# Patient Record
Sex: Female | Born: 1955 | Race: White | Hispanic: No | Smoking: Former smoker
Health system: Southern US, Community
[De-identification: ages and names within clinical notes are randomized; demographics above are authoritative.]

## PROBLEM LIST (undated history)

## (undated) DIAGNOSIS — M8580 Other specified disorders of bone density and structure, unspecified site: Secondary | ICD-10-CM

## (undated) DIAGNOSIS — I10 Essential (primary) hypertension: Secondary | ICD-10-CM

## (undated) DIAGNOSIS — T7840XA Allergy, unspecified, initial encounter: Secondary | ICD-10-CM

## (undated) DIAGNOSIS — E785 Hyperlipidemia, unspecified: Secondary | ICD-10-CM

## (undated) DIAGNOSIS — N6009 Solitary cyst of unspecified breast: Secondary | ICD-10-CM

## (undated) DIAGNOSIS — M1990 Unspecified osteoarthritis, unspecified site: Secondary | ICD-10-CM

## (undated) HISTORY — PX: WISDOM TOOTH EXTRACTION: SHX21

## (undated) HISTORY — DX: Other specified disorders of bone density and structure, unspecified site: M85.80

## (undated) HISTORY — PX: BREAST CYST ASPIRATION: SHX578

## (undated) HISTORY — DX: Essential (primary) hypertension: I10

## (undated) HISTORY — DX: Allergy, unspecified, initial encounter: T78.40XA

## (undated) HISTORY — DX: Unspecified osteoarthritis, unspecified site: M19.90

## (undated) HISTORY — PX: TONSILLECTOMY: SUR1361

## (undated) HISTORY — DX: Hyperlipidemia, unspecified: E78.5

---

## 2018-04-05 HISTORY — PX: COLONOSCOPY: SHX174

## 2018-04-05 HISTORY — PX: POLYPECTOMY: SHX149

## 2019-04-17 ENCOUNTER — Other Ambulatory Visit: Payer: Self-pay | Admitting: Internal Medicine

## 2019-04-17 DIAGNOSIS — M8580 Other specified disorders of bone density and structure, unspecified site: Secondary | ICD-10-CM

## 2019-04-17 DIAGNOSIS — Z1231 Encounter for screening mammogram for malignant neoplasm of breast: Secondary | ICD-10-CM

## 2019-05-31 ENCOUNTER — Ambulatory Visit: Payer: 59 | Attending: Internal Medicine

## 2019-05-31 DIAGNOSIS — Z23 Encounter for immunization: Secondary | ICD-10-CM | POA: Insufficient documentation

## 2019-05-31 NOTE — Progress Notes (Signed)
   Covid-19 Vaccination Clinic  Name:  Kelsey Strickland    MRN: [id number] DOB: 12-22-1955  05/31/2019  [name] was observed post Covid-19 immunization for 15 minutes without incidence. She was provided with Vaccine Information Sheet and instruction to access the V-Safe system.   [name] was instructed to call 911 with any severe reactions post vaccine: Marland Kitchen Difficulty breathing  . Swelling of your face and throat  . A fast heartbeat  . A bad rash all over your body  . Dizziness and weakness    Immunizations Administered    Name Date Dose VIS Date Route   Pfizer COVID-19 Vaccine 05/31/2019  9:33 AM 0.3 mL 03/16/2019 Intramuscular   Manufacturer: [place]   Lot: [id number]   [place]: [id number]

## 2019-06-19 ENCOUNTER — Ambulatory Visit
Admission: RE | Admit: 2019-06-19 | Discharge: 2019-06-19 | Disposition: A | Discharge status: Home / Self Care | Payer: 59 | Source: Ambulatory Visit | Attending: Internal Medicine | Admitting: Internal Medicine

## 2019-06-19 ENCOUNTER — Other Ambulatory Visit: Payer: Self-pay

## 2019-06-19 DIAGNOSIS — M8580 Other specified disorders of bone density and structure, unspecified site: Secondary | ICD-10-CM

## 2019-06-19 DIAGNOSIS — Z1231 Encounter for screening mammogram for malignant neoplasm of breast: Secondary | ICD-10-CM

## 2019-06-19 HISTORY — DX: Solitary cyst of unspecified breast: N60.09

## 2019-06-20 ENCOUNTER — Ambulatory Visit: Payer: 59 | Attending: Internal Medicine

## 2019-06-20 DIAGNOSIS — Z23 Encounter for immunization: Secondary | ICD-10-CM

## 2019-06-20 NOTE — Progress Notes (Signed)
   Covid-19 Vaccination Clinic  Name:  Kelsey Strickland    MRN: [id number] DOB: 1955/06/29  06/20/2019  Kelsey Strickland was observed post Covid-19 immunization for 15 minutes without incident. She was provided with Vaccine Information Sheet and instruction to access the V-Safe system.   Kelsey Strickland was instructed to call 911 with any severe reactions post vaccine: Marland Kitchen Difficulty breathing  . Swelling of face and throat  . A fast heartbeat  . A bad rash all over body  . Dizziness and weakness   Immunizations Administered    Name Date Dose VIS Date Route   Pfizer COVID-19 Vaccine 06/20/2019 11:30 AM 0.3 mL 03/16/2019 Intramuscular   Manufacturer: [place]   Lot: [id number]   [place]: [id number]

## 2020-07-17 DIAGNOSIS — Z1231 Encounter for screening mammogram for malignant neoplasm of breast: Secondary | ICD-10-CM

## 2020-07-18 ENCOUNTER — Other Ambulatory Visit: Payer: Self-pay

## 2020-07-18 ENCOUNTER — Ambulatory Visit
Admission: RE | Admit: 2020-07-18 | Discharge: 2020-07-18 | Disposition: A | Discharge status: Home / Self Care | Payer: 59 | Source: Ambulatory Visit

## 2020-07-18 DIAGNOSIS — Z1231 Encounter for screening mammogram for malignant neoplasm of breast: Secondary | ICD-10-CM

## 2021-05-15 ENCOUNTER — Other Ambulatory Visit: Payer: Self-pay | Admitting: Internal Medicine

## 2021-05-15 DIAGNOSIS — M810 Age-related osteoporosis without current pathological fracture: Secondary | ICD-10-CM

## 2021-05-18 ENCOUNTER — Other Ambulatory Visit: Payer: Self-pay | Admitting: Internal Medicine

## 2021-05-18 DIAGNOSIS — N6320 Unspecified lump in the left breast, unspecified quadrant: Secondary | ICD-10-CM

## 2021-06-03 ENCOUNTER — Ambulatory Visit
Admission: RE | Admit: 2021-06-03 | Discharge: 2021-06-03 | Disposition: A | Discharge status: Home / Self Care | Payer: Medicare Other | Source: Ambulatory Visit | Attending: Internal Medicine | Admitting: Internal Medicine

## 2021-06-03 ENCOUNTER — Other Ambulatory Visit: Payer: Self-pay | Admitting: Internal Medicine

## 2021-06-03 DIAGNOSIS — N6320 Unspecified lump in the left breast, unspecified quadrant: Secondary | ICD-10-CM

## 2021-06-30 ENCOUNTER — Telehealth: Payer: Self-pay | Admitting: Internal Medicine

## 2021-06-30 NOTE — Telephone Encounter (Signed)
Good Afternoon Dr. Lorenso Courier, ? ? ?We have received records for review for patient to have a colonoscopy. Patients last colonoscopy was 2020 in [place]. I will send her  records for you to review, will you please advise on scheduling? ? ?Thank you.  ?

## 2021-07-02 ENCOUNTER — Encounter: Payer: Self-pay | Admitting: Internal Medicine

## 2021-08-12 ENCOUNTER — Ambulatory Visit (AMBULATORY_SURGERY_CENTER): Payer: Medicare Other | Admitting: *Deleted

## 2021-08-12 VITALS — Ht 63.0 in | Wt 176.0 lb

## 2021-08-12 DIAGNOSIS — Z86010 Personal history of colonic polyps: Secondary | ICD-10-CM

## 2021-08-12 MED ORDER — NA SULFATE-K SULFATE-MG SULF 17.5-3.13-1.6 GM/177ML PO SOLN: 1.0000 | Freq: Once | ORAL | 0 refills | Status: AC

## 2021-08-12 NOTE — Progress Notes (Signed)

## 2021-08-21 ENCOUNTER — Encounter: Payer: Self-pay | Admitting: Internal Medicine

## 2021-08-26 ENCOUNTER — Encounter: Payer: Self-pay | Admitting: Internal Medicine

## 2021-08-26 ENCOUNTER — Ambulatory Visit (AMBULATORY_SURGERY_CENTER): Payer: Medicare Other | Admitting: Internal Medicine

## 2021-08-26 VITALS — BP 103/60 | HR 81 | Temp 97.7°F | Resp 12 | Ht 60.0 in | Wt 176.0 lb

## 2021-08-26 DIAGNOSIS — D123 Benign neoplasm of transverse colon: Secondary | ICD-10-CM

## 2021-08-26 DIAGNOSIS — Z86010 Personal history of colonic polyps: Secondary | ICD-10-CM

## 2021-08-26 DIAGNOSIS — D122 Benign neoplasm of ascending colon: Secondary | ICD-10-CM | POA: Diagnosis not present

## 2021-08-26 MED ORDER — SODIUM CHLORIDE 0.9 % IV SOLN: 500.0000 mL | Freq: Once | INTRAVENOUS | Status: DC

## 2021-08-26 NOTE — Patient Instructions (Signed)
Thank you for coming in to see [place] today. Resume previous diet and medications today.  Return to normal daily activities tomorrow. Polyp biopsies will be available in 1-2 weeks.  Will make recommendation at that time for future colonoscopy.   YOU HAD AN ENDOSCOPIC PROCEDURE TODAY AT [facility]:   Refer to the procedure report that was given to you for any specific questions about what was found during the examination.  If the procedure report does not answer your questions, please call your gastroenterologist to clarify.  If you requested that your care partner not be given the details of your procedure findings, then the procedure report has been included in a sealed envelope for you to review at your convenience later.  YOU SHOULD EXPECT: Some feelings of bloating in the abdomen. Passage of more gas than usual.  Walking can help get rid of the air that was put into your GI tract during the procedure and reduce the bloating. If you had a lower endoscopy (such as a colonoscopy or flexible sigmoidoscopy) you may notice spotting of blood in your stool or on the toilet paper. If you underwent a bowel prep for your procedure, you may not have a normal bowel movement for a few days.  Please Note:  You might notice some irritation and congestion in your nose or some drainage.  This is from the oxygen used during your procedure.  There is no need for concern and it should clear up in a day or so.  SYMPTOMS TO REPORT IMMEDIATELY:  Following lower endoscopy (colonoscopy or flexible sigmoidoscopy):  Excessive amounts of blood in the stool  Significant tenderness or worsening of abdominal pains  Swelling of the abdomen that is new, acute  Fever of 100F or higher    For urgent or emergent issues, a gastroenterologist can be reached at any hour by calling [phone]. Do not use MyChart messaging for urgent concerns.    DIET:  We do recommend a small meal at first, but then you may  proceed to your regular diet.  Drink plenty of fluids but you should avoid alcoholic beverages for 24 hours.  ACTIVITY:  You should plan to take it easy for the rest of today and you should NOT DRIVE or use heavy machinery until tomorrow (because of the sedation medicines used during the test).    FOLLOW UP: Our staff will call the number listed on your records 48-72 hours following your procedure to check on you and address any questions or concerns that you may have regarding the information given to you following your procedure. If we do not reach you, we will leave a message.  We will attempt to reach you two times.  During this call, we will ask if you have developed any symptoms of COVID 19. If you develop any symptoms (ie: fever, flu-like symptoms, shortness of breath, cough etc.) before then, please call [phone].  If you test positive for Covid 19 in the 2 weeks post procedure, please call and report this information to [place].    If any biopsies were taken you will be contacted by phone or by letter within the next 1-3 weeks.  Please call us at [phone] if you have not heard about the biopsies in 3 weeks.    SIGNATURES/CONFIDENTIALITY: You and/or your care partner have signed paperwork which will be entered into your electronic medical record.  These signatures attest to the fact that that the information above on your After  Visit Summary has been reviewed and is understood.  Full responsibility of the confidentiality of this discharge information lies with you and/or your care-partner.

## 2021-08-26 NOTE — Progress Notes (Signed)
GASTROENTEROLOGY PROCEDURE H&P NOTE   Primary Care Physician: Michael Boston, MD    Reason for Procedure:   Colon cancer screening  Plan:    Colonoscopy  Patient is appropriate for endoscopic procedure(s) in the ambulatory ([facility]) setting.  The nature of the procedure, as well as the risks, benefits, and alternatives were carefully and thoroughly reviewed with the patient. Ample time for discussion and questions allowed. The patient understood, was satisfied, and agreed to proceed.     HPI: Kelsey Strickland is a 66 y.o. female who presents for colonoscopy for colon cancer screening. Denies blood in stools, changes in bowel habits, weight loss. Denies fam hx of colon cancer. Her last colonoscopy was 3 years ago that showed polyps.  Past Medical History:  Diagnosis Date   Allergy    seasonal   Arthritis    mild knee   Breast cyst    Hyperlipidemia    borderline   Hypertension    on meds   Osteopenia     Past Surgical History:  Procedure Laterality Date   BREAST CYST ASPIRATION     25 yrs ago   COLONOSCOPY  04/2018   POLYPECTOMY  04/2018   TONSILLECTOMY     WISDOM TOOTH EXTRACTION      Prior to Admission medications   Medication Sig Start Date End Date Taking? Authorizing Provider  amLODipine (NORVASC) 5 MG tablet Take 5 mg by mouth daily. 06/29/21  Yes [provider]  Calcium Carbonate-Vit D-Min (CALTRATE 600+D PLUS PO) one tab by mouth daily Oral 04/10/19  Yes [provider]  cholecalciferol (VITAMIN D3) 25 MCG (1000 UNIT) tablet 1 tab by mouth daily Oral 04/10/19  Yes [provider]  olmesartan (BENICAR) 40 MG tablet Take 40 mg by mouth daily. 06/29/21  Yes [provider]  alendronate (FOSAMAX) 70 MG tablet  06/17/21   [provider]  azithromycin (ZITHROMAX) 250 MG tablet Take 250 mg by mouth as directed. 08/10/21   [provider]  predniSONE (DELTASONE) 20 MG tablet Take 20 mg by mouth daily. 08/10/21    [provider]    Current Outpatient Medications  Medication Sig Dispense Refill   amLODipine (NORVASC) 5 MG tablet Take 5 mg by mouth daily.     Calcium Carbonate-Vit D-Min (CALTRATE 600+D PLUS PO) one tab by mouth daily Oral     cholecalciferol (VITAMIN D3) 25 MCG (1000 UNIT) tablet 1 tab by mouth daily Oral     olmesartan (BENICAR) 40 MG tablet Take 40 mg by mouth daily.     alendronate (FOSAMAX) 70 MG tablet      azithromycin (ZITHROMAX) 250 MG tablet Take 250 mg by mouth as directed.     predniSONE (DELTASONE) 20 MG tablet Take 20 mg by mouth daily.     Current Facility-Administered Medications  Medication Dose Route Frequency Provider Last Rate Last Admin   0.9 %  sodium chloride infusion  500 mL Intravenous Once Sharyn Creamer, MD        Allergies as of 08/26/2021   (No Known Allergies)    Family History  Problem Relation Age of Onset   Colon polyps Mother    Colitis Sister    Crohn's disease Sister    Breast cancer Sister    Breast cancer Sister    Colitis Sister    Celiac disease Sister    Crohn's disease Sister    Colon polyps Brother    Colon cancer Neg Hx  Esophageal cancer Neg Hx    Rectal cancer Neg Hx    Stomach cancer Neg Hx     Social History   Socioeconomic History   Marital status: Widowed    Spouse name: Not on file   Number of children: Not on file   Years of education: Not on file   Highest education level: Not on file  Occupational History   Not on file  Tobacco Use   Smoking status: Former    Types: Cigarettes   Smokeless tobacco: Never   Tobacco comments:    Quit age 47   Substance and Sexual Activity   Alcohol use: Yes    Comment: social - most evenings a glass of wine or 2   Drug use: Never   Sexual activity: Not on file  Other Topics Concern   Not on file  Social History Narrative   Not on file   Social Determinants of Health   Financial Resource Strain: Not on file  Food Insecurity: Not on file   Transportation Needs: Not on file  Physical Activity: Not on file  Stress: Not on file  Social Connections: Not on file  Intimate Partner Violence: Not on file    Physical Exam: Vital signs in last 24 hours: BP 105/73   Pulse 84   Temp 97.7 F (36.5 C) (Temporal)   Ht 5' (1.524 m)   Wt 176 lb (79.8 kg)   SpO2 100%   BMI 34.37 kg/m  GEN: NAD EYE: Sclerae anicteric ENT: MMM CV: Non-tachycardic Pulm: No increased work of breathing GI: Soft, NT/ND NEURO:  Alert & Oriented   Christia Reading, MD [facility]  08/26/2021 8:47 AM

## 2021-08-26 NOTE — Op Note (Signed)
[facility] Patient Name: Kelsey Strickland Procedure Date: 08/26/2021 9:19 AM MRN: [id number] Endoscopist: Sonny Masters "Kelsey Strickland ,  Age: 66 Referring MD:  Date of Birth: 1955/12/10 Gender: Female Account #: [id number] Procedure:                Colonoscopy Indications:              High risk colon cancer surveillance: Personal                            history of colonic polyps Medicines:                Monitored Anesthesia Care Procedure:                Pre-Anesthesia Assessment:                           - Prior to the procedure, a History and Physical                            was performed, and patient medications and                            allergies were reviewed. The patient's tolerance of                            previous anesthesia was also reviewed. The risks                            and benefits of the procedure and the sedation                            options and risks were discussed with the patient.                            All questions were answered, and informed consent                            was obtained. Prior Anticoagulants: The patient has                            taken no previous anticoagulant or antiplatelet                            agents. ASA Grade Assessment: II - A patient with                            mild systemic disease. After reviewing the risks                            and benefits, the patient was deemed in                            satisfactory condition to undergo the procedure.  After obtaining informed consent, the colonoscope                            was passed under direct vision. Throughout the                            procedure, the patient's blood pressure, pulse, and                            oxygen saturations were monitored continuously. The                            CF [id number] #[id number] was introduced through the anus                            and advanced to the the  terminal ileum. The                            colonoscopy was performed without difficulty. The                            patient tolerated the procedure well. The quality                            of the bowel preparation was good. The terminal                            ileum, ileocecal valve, appendiceal orifice, and                            rectum were photographed. Scope In: 9:37:50 AM Scope Out: 10:12:01 AM Scope Withdrawal Time: 0 hours 19 minutes 43 seconds  Total Procedure Duration: 0 hours 34 minutes 11 seconds  Findings:                 The terminal ileum appeared normal.                           Five sessile polyps were found in the transverse                            colon and ascending colon. The polyps were 2 to 4                            mm in size. These polyps were removed with a cold                            snare. Resection and retrieval were complete.                           Non-bleeding internal hemorrhoids were found during                            retroflexion. Complications:  No immediate complications. Estimated Blood Loss:     Estimated blood loss was minimal. Impression:               - The examined portion of the ileum was normal.                           - Five 2 to 4 mm polyps in the transverse colon and                            in the ascending colon, removed with a cold snare.                            Resected and retrieved.                           - Non-bleeding internal hemorrhoids. Recommendation:           - Discharge patient to home (with escort).                           - Await pathology results.                           - The findings and recommendations were discussed                            with the patient. Sonny Masters "Kelsey Strickland,  08/26/2021 10:15:10 AM

## 2021-08-26 NOTE — Progress Notes (Signed)
Called to room to assist during endoscopic procedure.  Patient ID and intended procedure confirmed with present staff. Received instructions for my participation in the procedure from the performing physician.  

## 2021-08-26 NOTE — Progress Notes (Signed)
Pt's states no medical or surgical changes since previsit or office visit. 

## 2021-08-26 NOTE — Progress Notes (Signed)
Report to PACU, RN, vss, BBS= Clear.  

## 2021-08-27 ENCOUNTER — Telehealth: Payer: Self-pay | Admitting: *Deleted

## 2021-08-27 NOTE — Telephone Encounter (Signed)
  Follow up Call-     08/26/2021    8:10 AM  Call back number  Post procedure Call Back phone  # [phone]  Permission to leave phone message Yes     Patient questions:  Do you have a fever, pain , or abdominal swelling? No. Pain Score  0 *  Have you tolerated food without any problems? Yes.    Have you been able to return to your normal activities? Yes.    Do you have any questions about your discharge instructions: Diet   No. Medications  No. Follow up visit  No.  Do you have questions or concerns about your Care? No.  Actions: * If pain score is 4 or above: No action needed, pain <4.

## 2021-09-01 ENCOUNTER — Encounter: Payer: Self-pay | Admitting: Internal Medicine

## 2021-11-03 ENCOUNTER — Ambulatory Visit
Admission: RE | Admit: 2021-11-03 | Discharge: 2021-11-03 | Disposition: A | Discharge status: Home / Self Care | Payer: Medicare Other | Source: Ambulatory Visit | Attending: Internal Medicine | Admitting: Internal Medicine

## 2021-11-03 DIAGNOSIS — M810 Age-related osteoporosis without current pathological fracture: Secondary | ICD-10-CM

## 2022-04-21 ENCOUNTER — Other Ambulatory Visit: Payer: Self-pay | Admitting: Internal Medicine

## 2022-04-21 DIAGNOSIS — Z1231 Encounter for screening mammogram for malignant neoplasm of breast: Secondary | ICD-10-CM

## 2022-06-08 ENCOUNTER — Ambulatory Visit
Admission: RE | Admit: 2022-06-08 | Discharge: 2022-06-08 | Disposition: A | Discharge status: Home / Self Care | Payer: Medicare Other | Source: Ambulatory Visit

## 2022-06-08 DIAGNOSIS — Z1231 Encounter for screening mammogram for malignant neoplasm of breast: Secondary | ICD-10-CM

## 2022-06-10 ENCOUNTER — Other Ambulatory Visit: Payer: Self-pay | Admitting: Internal Medicine

## 2022-06-10 DIAGNOSIS — E785 Hyperlipidemia, unspecified: Secondary | ICD-10-CM

## 2022-06-15 IMAGING — MG MM DIGITAL SCREENING BILAT W/ TOMO AND CAD
8 series · 8 of 24 positions shown · non-contrast
Comparison: Previous exam(s).

CLINICAL DATA: Screening.

EXAM:
DIGITAL SCREENING BILATERAL MAMMOGRAM WITH TOMOSYNTHESIS AND CAD
TECHNIQUE: Bilateral screening digital craniocaudal and mediolateral oblique
mammograms were obtained. Bilateral screening digital breast
tomosynthesis was performed. The images were evaluated with
computer-aided detection.

[L CC synth-2D]
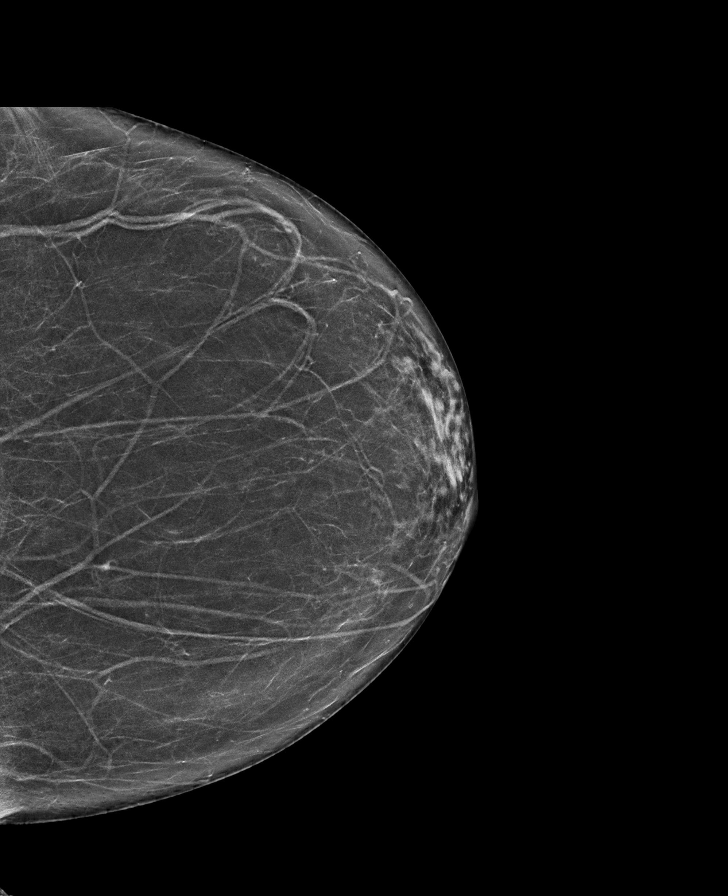

[R MLO synth-2D]
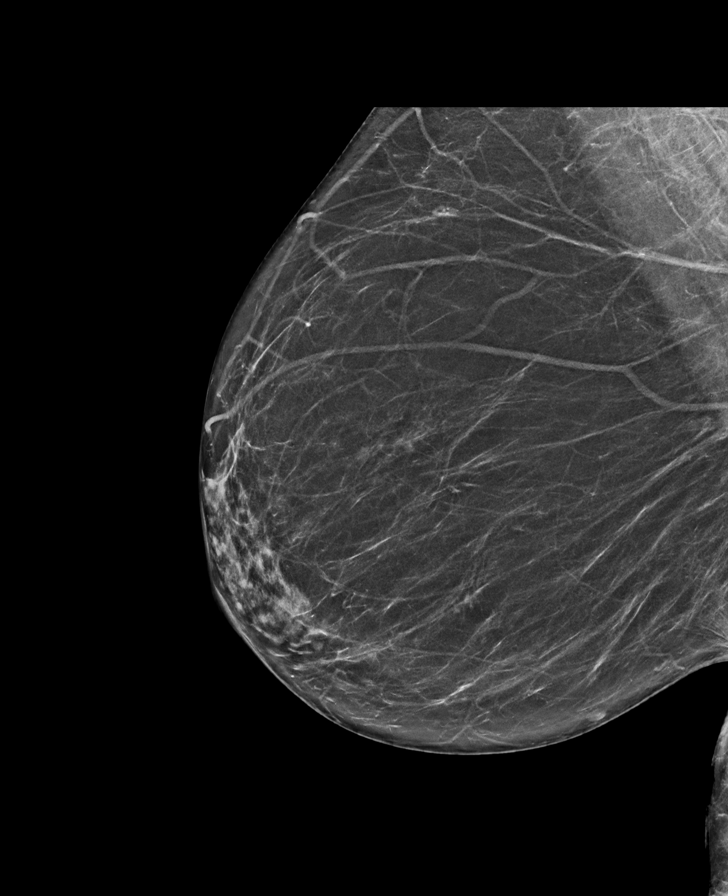

[R CC synth-2D]
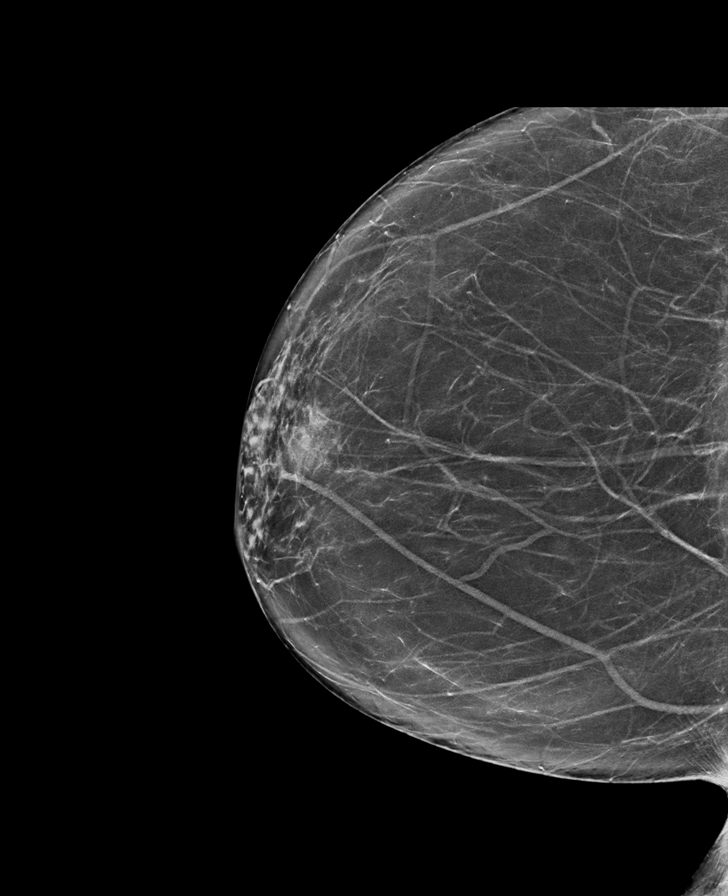

[L MLO synth-2D]
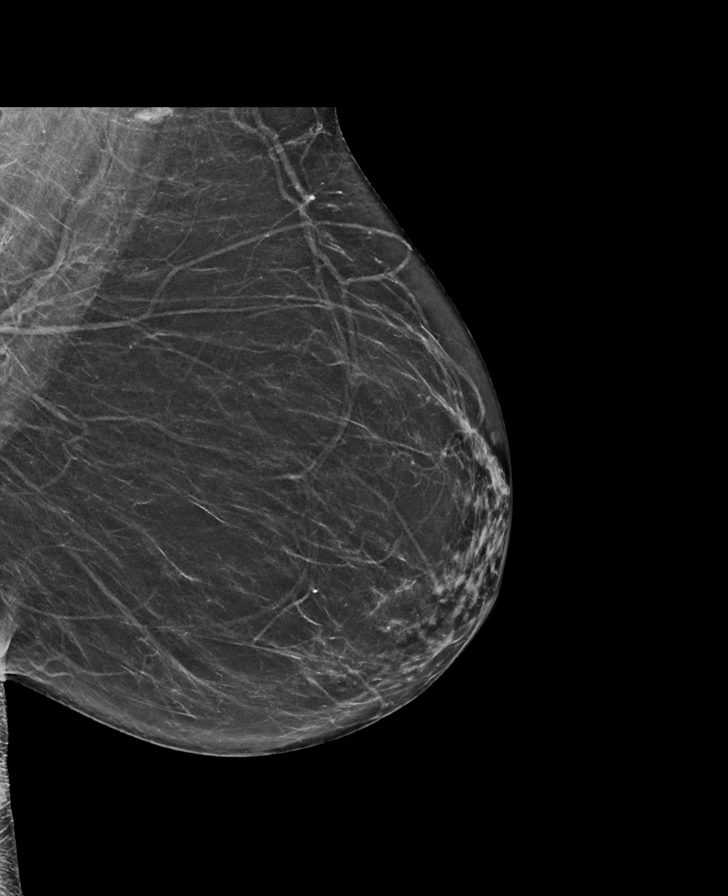

[L MLO tomo · tomo slice 35/68.0]
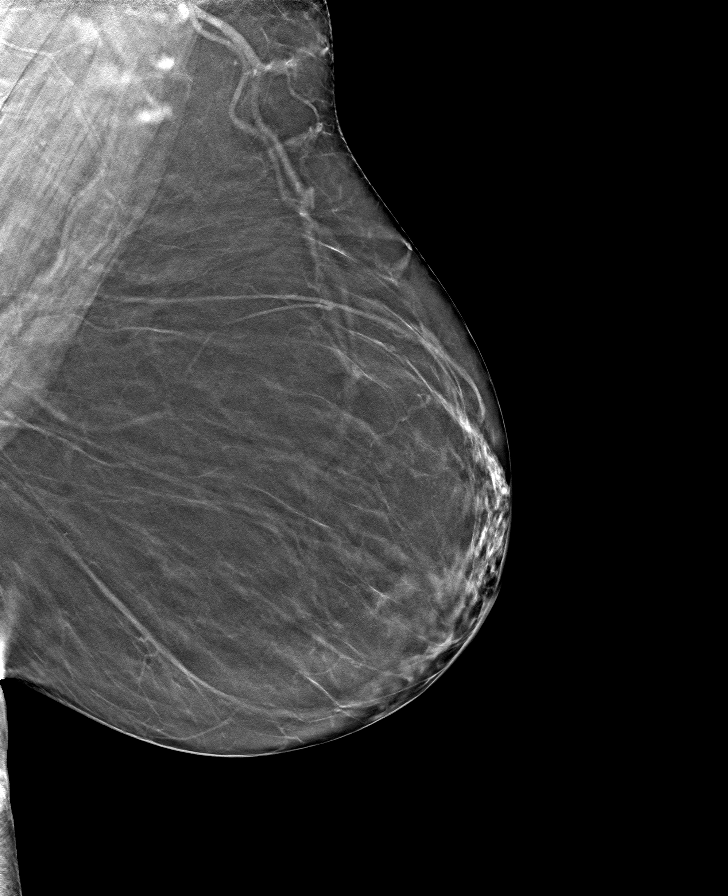

[R CC tomo · tomo slice 37/73.0]
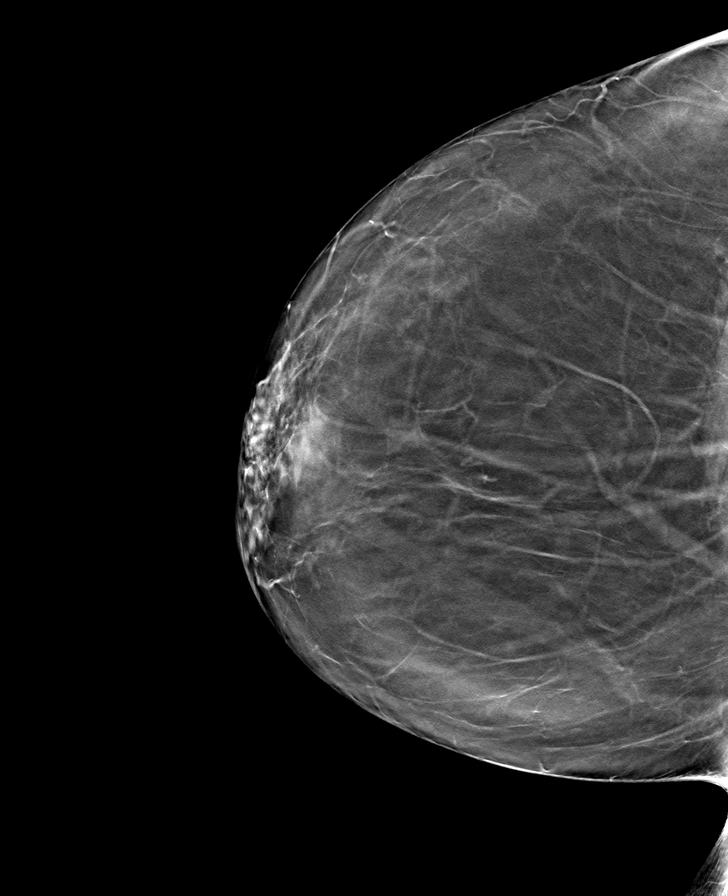

[L CC tomo · tomo slice 33/65.0]
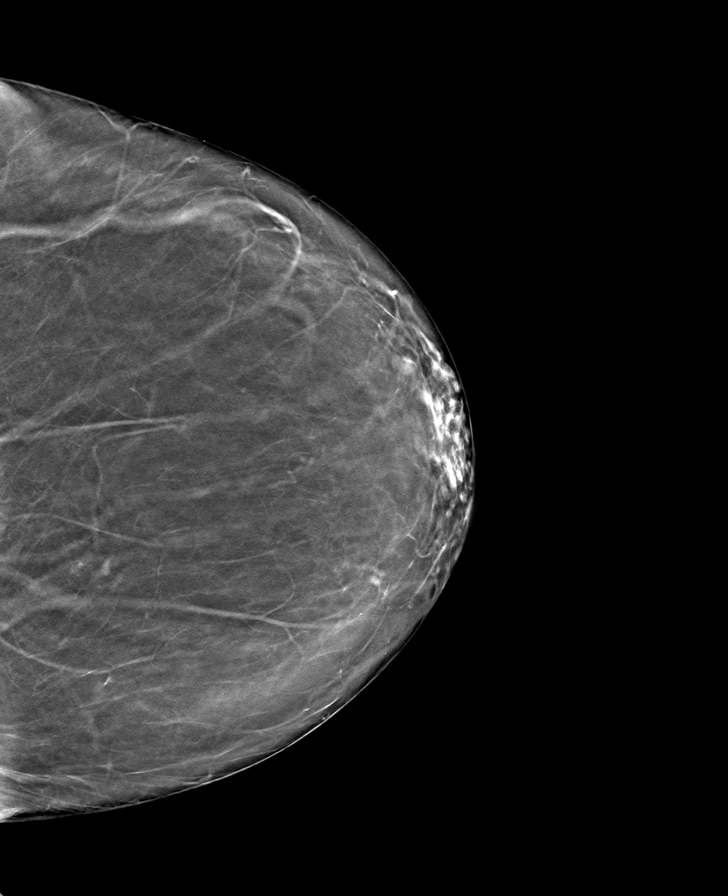

[R MLO tomo · tomo slice 35/70.0]
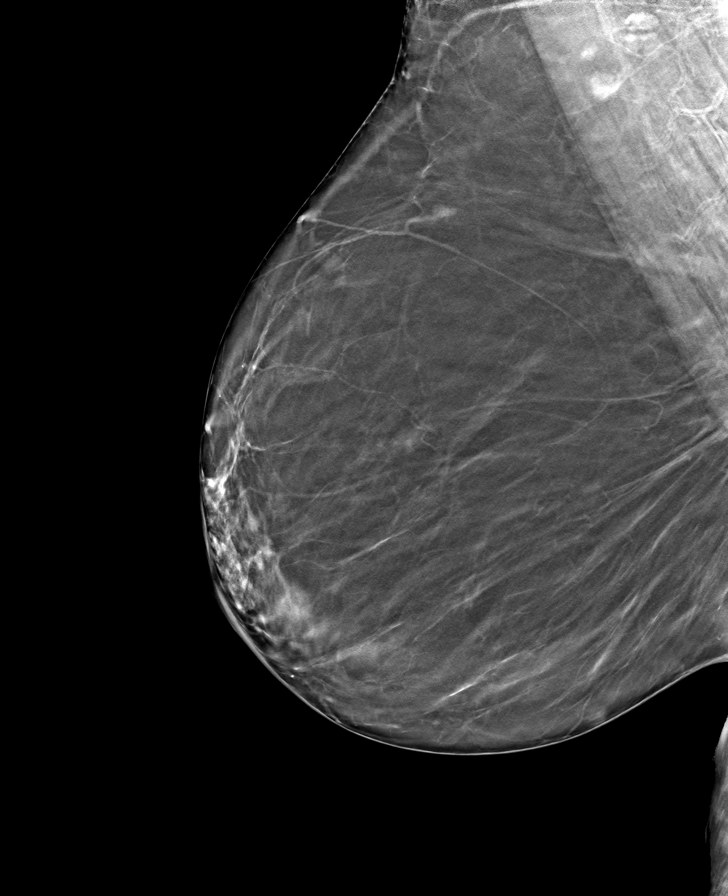

[8 of 24 positions shown; findings below may reference images not displayed]

ACR Breast Density Category b: There are scattered areas of
fibroglandular density.
FINDINGS: There are no findings suspicious for malignancy. The images were
evaluated with computer-aided detection.
IMPRESSION: No mammographic evidence of malignancy. A result letter of this
screening mammogram will be mailed directly to the patient.

RECOMMENDATION:
Screening mammogram in one year. (Code:WJ-[id number])

BI-RADS CATEGORY  1: Negative.

## 2022-08-19 ENCOUNTER — Ambulatory Visit
Admission: RE | Admit: 2022-08-19 | Discharge: 2022-08-19 | Disposition: A | Discharge status: Home / Self Care | Payer: No Typology Code available for payment source | Source: Ambulatory Visit | Attending: Internal Medicine | Admitting: Internal Medicine

## 2022-08-19 DIAGNOSIS — E785 Hyperlipidemia, unspecified: Secondary | ICD-10-CM

## 2023-03-21 ENCOUNTER — Other Ambulatory Visit: Payer: Self-pay

## 2023-03-21 ENCOUNTER — Observation Stay (HOSPITAL_BASED_OUTPATIENT_CLINIC_OR_DEPARTMENT_OTHER)
Admission: EM | Admit: 2023-03-21 | Discharge: 2023-03-22 | Disposition: A | Discharge status: Home / Self Care | Payer: Medicare Other | Attending: Family Medicine | Admitting: Family Medicine

## 2023-03-21 ENCOUNTER — Encounter (HOSPITAL_BASED_OUTPATIENT_CLINIC_OR_DEPARTMENT_OTHER): Payer: Self-pay

## 2023-03-21 DIAGNOSIS — R55 Syncope and collapse: Secondary | ICD-10-CM | POA: Diagnosis not present

## 2023-03-21 DIAGNOSIS — Z87891 Personal history of nicotine dependence: Secondary | ICD-10-CM | POA: Insufficient documentation

## 2023-03-21 DIAGNOSIS — I10 Essential (primary) hypertension: Secondary | ICD-10-CM | POA: Diagnosis not present

## 2023-03-21 DIAGNOSIS — Z79899 Other long term (current) drug therapy: Secondary | ICD-10-CM | POA: Insufficient documentation

## 2023-03-21 DIAGNOSIS — E871 Hypo-osmolality and hyponatremia: Principal | ICD-10-CM | POA: Insufficient documentation

## 2023-03-21 DIAGNOSIS — R42 Dizziness and giddiness: Secondary | ICD-10-CM | POA: Diagnosis present

## 2023-03-21 LAB — BASIC METABOLIC PANEL
Anion gap: 9 (ref 5–15)
Anion gap: 7 (ref 5–15)
BUN: 19 mg/dL (ref 8–23)
BUN: 10 mg/dL (ref 8–23)
CO2: 22 mmol/L (ref 22–32)
CO2: 23 mmol/L (ref 22–32)
Calcium: 9.4 mg/dL (ref 8.9–10.3)
Calcium: 9 mg/dL (ref 8.9–10.3)
Chloride: 88 mmol/L — ABNORMAL LOW (ref 98–111)
Chloride: 98 mmol/L (ref 98–111)
Creatinine, Ser: 0.82 mg/dL (ref 0.44–1.00)
Creatinine, Ser: 0.58 mg/dL (ref 0.44–1.00)
GFR, Estimated: >60 mL/min (ref >60)
GFR, Estimated: >60 mL/min (ref >60)
Glucose, Bld: 152 mg/dL — ABNORMAL HIGH (ref 70–99)
Glucose, Bld: 140 mg/dL — ABNORMAL HIGH (ref 70–99)
Potassium: 4.1 mmol/L (ref 3.5–5.1)
Potassium: 3.9 mmol/L (ref 3.5–5.1)
Sodium: 119 mmol/L — CL (ref 135–145)
Sodium: 128 mmol/L — ABNORMAL LOW (ref 135–145)

## 2023-03-21 LAB — URINALYSIS, ROUTINE W REFLEX MICROSCOPIC
Bacteria, UA: NONE SEEN
Bilirubin Urine: NEGATIVE
Glucose, UA: NEGATIVE mg/dL
Hgb urine dipstick: NEGATIVE
Ketones, ur: 15 mg/dL — AB
Leukocytes,Ua: NEGATIVE
Nitrite: NEGATIVE
Protein, ur: NEGATIVE mg/dL
Specific Gravity, Urine: 1.009 (ref 1.005–1.030)
pH: 7 (ref 5.0–8.0)

## 2023-03-21 LAB — CBC
HCT: 40.4 % (ref 36.0–46.0)
HCT: 39.6 % (ref 36.0–46.0)
Hemoglobin: 14 g/dL (ref 12.0–15.0)
Hemoglobin: 13.7 g/dL (ref 12.0–15.0)
MCH: 30.2 pg (ref 26.0–34.0)
MCH: 30.9 pg (ref 26.0–34.0)
MCHC: 34.7 g/dL (ref 30.0–36.0)
MCHC: 34.6 g/dL (ref 30.0–36.0)
MCV: 87.1 fL (ref 80.0–100.0)
MCV: 89.4 fL (ref 80.0–100.0)
Platelets: 316 10*3/uL (ref 150–400)
Platelets: 314 10*3/uL (ref 150–400)
RBC: 4.64 MIL/uL (ref 3.87–5.11)
RBC: 4.43 MIL/uL (ref 3.87–5.11)
RDW: 12.8 % (ref 11.5–15.5)
RDW: 12.8 % (ref 11.5–15.5)
WBC: 9 10*3/uL (ref 4.0–10.5)
WBC: 6.4 10*3/uL (ref 4.0–10.5)
nRBC: 0 % (ref 0.0–0.2)
nRBC: 0 % (ref 0.0–0.2)

## 2023-03-21 LAB — CREATININE, SERUM
Creatinine, Ser: 0.56 mg/dL (ref 0.44–1.00)
GFR, Estimated: >60 mL/min (ref >60)

## 2023-03-21 LAB — OSMOLALITY, URINE: Osmolality, Ur: 114 mosm/kg — ABNORMAL LOW (ref 300–900)

## 2023-03-21 LAB — SODIUM, URINE, RANDOM: Sodium, Ur: 19 mmol/L

## 2023-03-21 LAB — CBG MONITORING, ED: Glucose-Capillary: 147 mg/dL — ABNORMAL HIGH (ref 70–99)

## 2023-03-21 LAB — HIV ANTIBODY (ROUTINE TESTING W REFLEX): HIV Screen 4th Generation wRfx: NONREACTIVE

## 2023-03-21 LAB — TROPONIN I (HIGH SENSITIVITY): Troponin I (High Sensitivity): 3 ng/L (ref <18)

## 2023-03-21 LAB — OSMOLALITY: Osmolality: 277 mosm/kg (ref 275–295)

## 2023-03-21 LAB — SODIUM: Sodium: 129 mmol/L — ABNORMAL LOW (ref 135–145)

## 2023-03-21 MED ORDER — SODIUM CHLORIDE 0.9 % IV SOLN: INTRAVENOUS | Status: AC

## 2023-03-21 MED ORDER — SODIUM CHLORIDE 0.9 % IV BOLUS
1000.0000 mL | Freq: Once | INTRAVENOUS | Status: AC
  Administered 2023-03-21: 1000 mL via INTRAVENOUS

## 2023-03-21 MED ORDER — ONDANSETRON HCL 4 MG PO TABS: 4.0000 mg | ORAL_TABLET | Freq: Four times a day (QID) | ORAL | Status: DC | PRN

## 2023-03-21 MED ORDER — ACETAMINOPHEN 650 MG RE SUPP: 650.0000 mg | Freq: Four times a day (QID) | RECTAL | Status: DC | PRN

## 2023-03-21 MED ORDER — ACETAMINOPHEN 325 MG PO TABS: 650.0000 mg | ORAL_TABLET | Freq: Four times a day (QID) | ORAL | Status: DC | PRN

## 2023-03-21 MED ORDER — ONDANSETRON HCL 4 MG/2ML IJ SOLN
4.0000 mg | Freq: Four times a day (QID) | INTRAMUSCULAR | Status: DC | PRN
Start: 2023-03-21 — End: 2023-03-22

## 2023-03-21 MED ORDER — ENOXAPARIN SODIUM 40 MG/0.4ML IJ SOSY
40.0000 mg | PREFILLED_SYRINGE | INTRAMUSCULAR | Status: DC
Start: 2023-03-21 — End: 2023-03-22
  Filled 2023-03-21: qty 0.4

## 2023-03-21 NOTE — H&P (Signed)
History and Physical    ESPN WHRITENOUR LKG:[id number] DOB: 14-Jun-1955 DOA: 03/21/2023  PCP: Melida Quitter, MD  Patient coming from: Home  I have personally briefly reviewed patient's old medical records in [facility]  Chief Complaint: Dizziness  HPI: Kelsey Strickland is a 67 y.o. female with medical history significant of osteoporosis, hypertension and GERD presented to ED with complaint of dizziness.  Per patient, she was at Christmas party at the neighbors house, that started at 6 PM, at around 8 PM, she started feeling dizzy and weak and sat down to the floor.  This happened twice within few minutes.  She did not lose her consciousness.  She had no other complaint such as chest pain, shortness of breath, problem with vision, speech, any focal deficit, nausea, vomiting, fever, chills, sweating or any other complaint.  She spent few more hours over there and went home around 9 PM.  Tried to went to the bed around 10 PM.  She felt nauseous intermittently but did not vomit.  She was restless and could not sleep until 2:30 AM when her daughter brought her to the emergency department.  ED Course: Upon arrival to ED, she was hemodynamically stable but was found to have sodium of 119.  She was accepted to hospitalist service for further management of hyponatremia.  Between then and arrival to the [facility], her sodium has improved up to 128.  She received 1 L of IV fluid bolus in the ED.  Review of Systems: As per HPI otherwise negative.    Past Medical History:  Diagnosis Date   Allergy    seasonal   Arthritis    mild knee   Breast cyst    Hyperlipidemia    borderline   Hypertension    on meds   Osteopenia     Past Surgical History:  Procedure Laterality Date   BREAST CYST ASPIRATION     25 yrs ago   COLONOSCOPY  04/2018   POLYPECTOMY  04/2018   TONSILLECTOMY     WISDOM TOOTH EXTRACTION       reports that she has quit smoking. Her smoking use included  cigarettes. She has never used smokeless tobacco. She reports current alcohol use. She reports that she does not use drugs.  No Known Allergies  Family History  Problem Relation Age of Onset   Colon polyps Mother    Colitis Sister    Crohn's disease Sister    Breast cancer Sister    Breast cancer Sister    Colitis Sister    Celiac disease Sister    Crohn's disease Sister    Colon polyps Brother    Colon cancer Neg Hx    Esophageal cancer Neg Hx    Rectal cancer Neg Hx    Stomach cancer Neg Hx     Prior to Admission medications   Medication Sig Start Date End Date Taking? Authorizing Provider  alendronate (FOSAMAX) 70 MG tablet  06/17/21   [provider]  amLODipine (NORVASC) 5 MG tablet Take 5 mg by mouth daily. 06/29/21   [provider]  azithromycin (ZITHROMAX) 250 MG tablet Take 250 mg by mouth as directed. 08/10/21   [provider]  Calcium Carbonate-Vit D-Min (CALTRATE 600+D PLUS PO) one tab by mouth daily Oral 04/10/19   [provider]  cholecalciferol (VITAMIN D3) 25 MCG (1000 UNIT) tablet 1 tab by mouth daily Oral 04/10/19   [provider]  olmesartan (BENICAR) 40  MG tablet Take 40 mg by mouth daily. 06/29/21   [provider]  predniSONE (DELTASONE) 20 MG tablet Take 20 mg by mouth daily. 08/10/21   [provider]    Physical Exam: Vitals:   03/21/23 1330 03/21/23 1415 03/21/23 1424 03/21/23 1424  BP: 125/66  118/69   Pulse: 77  73 74  Resp: 15  18   Temp:      TempSrc:      SpO2: 99%  96% 96%  Weight:  79.4 kg    Height:  5\' 4"  (1.626 m)      Constitutional: NAD, calm, comfortable Vitals:   03/21/23 1330 03/21/23 1415 03/21/23 1424 03/21/23 1424  BP: 125/66  118/69   Pulse: 77  73 74  Resp: 15  18   Temp:      TempSrc:      SpO2: 99%  96% 96%  Weight:  79.4 kg    Height:  5\' 4"  (1.626 m)     Eyes: PERRL, lids and conjunctivae normal ENMT: Mucous membranes are moist. Posterior pharynx clear of  any exudate or lesions.Normal dentition.  Neck: normal, supple, no masses, no thyromegaly Respiratory: clear to auscultation bilaterally, no wheezing, no crackles. Normal respiratory effort. No accessory muscle use.  Cardiovascular: Regular rate and rhythm, no murmurs / rubs / gallops. No extremity edema. 2+ pedal pulses. No carotid bruits.  Abdomen: no tenderness, no masses palpated. No hepatosplenomegaly. Bowel sounds positive.  Musculoskeletal: no clubbing / cyanosis. No joint deformity upper and lower extremities. Good ROM, no contractures. Normal muscle tone.  Skin: no rashes, lesions, ulcers. No induration Neurologic: CN 2-12 grossly intact. Sensation intact, DTR normal. Strength 5/5 in all 4.  Psychiatric: Normal judgment and insight. Alert and oriented x 3. Normal mood.    Labs on Admission: I have personally reviewed following labs and imaging studies  CBC: Recent Labs  Lab 03/21/23 0313  WBC 9.0  HGB 14.0  HCT 40.4  MCV 87.1  PLT 316   Basic Metabolic Panel: Recent Labs  Lab 03/21/23 0313 03/21/23 1140  NA 119* 128*  K 4.1 3.9  CL 88* 98  CO2 22 23  GLUCOSE 152* 140*  BUN 19 10  CREATININE 0.82 0.58  CALCIUM 9.4 9.0   GFR: Estimated Creatinine Clearance: 68.6 mL/min (by C-G formula based on SCr of 0.58 mg/dL). Liver Function Tests: No results for input(s): "AST", "ALT", "ALKPHOS", "BILITOT", "PROT", "ALBUMIN" in the last 168 hours. No results for input(s): "LIPASE", "AMYLASE" in the last 168 hours. No results for input(s): "AMMONIA" in the last 168 hours. Coagulation Profile: No results for input(s): "INR", "PROTIME" in the last 168 hours. Cardiac Enzymes: No results for input(s): "CKTOTAL", "CKMB", "CKMBINDEX", "TROPONINI" in the last 168 hours. BNP (last 3 results) No results for input(s): "PROBNP" in the last 8760 hours. HbA1C: No results for input(s): "HGBA1C" in the last 72 hours. CBG: Recent Labs  Lab 03/21/23 1138  GLUCAP 147*   Lipid  Profile: No results for input(s): "CHOL", "HDL", "LDLCALC", "TRIG", "CHOLHDL", "LDLDIRECT" in the last 72 hours. Thyroid Function Tests: No results for input(s): "TSH", "T4TOTAL", "FREET4", "T3FREE", "THYROIDAB" in the last 72 hours. Anemia Panel: No results for input(s): "VITAMINB12", "FOLATE", "FERRITIN", "TIBC", "IRON", "RETICCTPCT" in the last 72 hours. Urine analysis:    Component Value Date/Time   COLORURINE COLORLESS (A) 03/21/2023 0504   APPEARANCEUR CLEAR 03/21/2023 0504   LABSPEC 1.009 03/21/2023 0504   PHURINE 7.0 03/21/2023 0504   GLUCOSEU NEGATIVE 03/21/2023 0504  HGBUR NEGATIVE 03/21/2023 0504   BILIRUBINUR NEGATIVE 03/21/2023 0504   KETONESUR 15 (A) 03/21/2023 0504   PROTEINUR NEGATIVE 03/21/2023 0504   NITRITE NEGATIVE 03/21/2023 0504   LEUKOCYTESUR NEGATIVE 03/21/2023 0504    Radiological Exams on Admission: No results found.  EKG: Independently reviewed.  Sinus rhythm  Assessment/Plan Principal Problem:   Hyponatremia Active Problems:   Near syncope   Essential hypertension   Near syncope: Likely secondary to hyponatremia.  Please see below.  Acute hypovolemic hyponatremia: 119 yesterday.  Improved to 1 1:28 liter of IV fluid bolus.  Still appears dehydrated.  Encouraged to drink fluid.  Will start on normal saline at 100 cc/h for 20 hours, check sodium later today and tomorrow morning.  Urine and serum osmolality pending, will add urine sodium to check for SIADH.  History of hypertension: Takes amlodipine and olmesartan at home but blood pressure low normal so we will hold those.  DVT prophylaxis: enoxaparin (LOVENOX) injection 40 mg Start: 03/21/23 1545 Code Status: Full code Family Communication: None present at bedside.  Plan of care discussed with patient in length and he verbalized understanding and agreed with it. Disposition Plan: Potential discharge tomorrow. Consults called: None  Hughie Closs MD [facility]  *Please note that this  is a verbal dictation therefore any spelling or grammatical errors are due to the "[employer]agon [employer] One" system interpretation.  Please page via Amion and do not message via secure chat for urgent patient care matters. Secure chat can be used for non urgent patient care matters. 03/21/2023, 2:49 PM  To contact the attending provider between 7A-7P or the covering provider during after hours 7P-7A, please log into the web site [email or web address]

## 2023-03-21 NOTE — ED Notes (Signed)
Pt ambulated to the bathroom. No c/o dizziness. Steady on her feet, standby assist

## 2023-03-21 NOTE — Progress Notes (Signed)
Plan of Care Note for accepted transfer   Patient: Kelsey Strickland MRN: [id number]   DOA: 03/21/2023  Facility requesting transfer: [facility]   Requesting Provider: Dr. Judd Lien  Reason for transfer: Hyponatremia   Facility course: 67 yr old female with hx of HTN and HLD developed dizziness and near-syncope while at a party last night, then woke early this am with nausea and was encourage by family to present to ED.   She is found to have serum sodium of 119 without obvious etiology. She is currently asymptomatic. She was given 1 liter of NS in ED.   Plan of care: The patient is accepted for admission to Progressive unit, at [facility].   Author: Briscoe Deutscher, MD 03/21/2023  Check [email or web address] for on-call coverage.  Nursing staff, Please call [facility] Admits & Consults System-Wide number on Amion as soon as patient's arrival, so appropriate admitting provider can evaluate the pt.

## 2023-03-21 NOTE — ED Triage Notes (Signed)
PT to triage c/o near syncopal episode 6 hours prior to arrival. Pt states she had burning sensation in her chest with dizziness that has continued. Pt a/o x 4 skin warm dry, VSS NAD Pt on room air. Pt denies SOB, Fever Chills.

## 2023-03-21 NOTE — Plan of Care (Signed)

## 2023-03-21 NOTE — ED Notes (Signed)
Lab called a Na level of 119. Dr. Judd Lien aware.

## 2023-03-21 NOTE — ED Provider Notes (Signed)
[place] EMERGENCY DEPARTMENT AT [facility] Provider Note   CSN: [id number] Arrival date & time: 03/21/23  0251     History  Chief Complaint  Patient presents with   Near Syncope    Kelsey Strickland is a 67 y.o. female.  Patient is a 67 year old female with past medical history of hypertension, hyperlipidemia, arthritis.  Patient presenting today with complaints of a near syncopal episodes.  She was attending a Christmas party this evening when at approximately 8 PM, she suddenly felt dizzy and flushed and as if she was going to pass out.  She had to sit down to keep from falling.  There was a physician in attendance at this party who tended to her.  This episode seemed to resolve after 20 minutes.  She then went home and attempted to rest.  She noticed that her blood pressure was elevated at home and also experienced 1 episode of nausea without vomiting.  She was then brought here by her daughter for further evaluation.  Patient is symptom-free at present.  The history is provided by the patient.       Home Medications Prior to Admission medications   Medication Sig Start Date End Date Taking? Authorizing Provider  alendronate (FOSAMAX) 70 MG tablet  06/17/21   [provider]  amLODipine (NORVASC) 5 MG tablet Take 5 mg by mouth daily. 06/29/21   [provider]  azithromycin (ZITHROMAX) 250 MG tablet Take 250 mg by mouth as directed. 08/10/21   [provider]  Calcium Carbonate-Vit D-Min (CALTRATE 600+D PLUS PO) one tab by mouth daily Oral 04/10/19   [provider]  cholecalciferol (VITAMIN D3) 25 MCG (1000 UNIT) tablet 1 tab by mouth daily Oral 04/10/19   [provider]  olmesartan (BENICAR) 40 MG tablet Take 40 mg by mouth daily. 06/29/21   [provider]  predniSONE (DELTASONE) 20 MG tablet Take 20 mg by mouth daily. 08/10/21   [provider]      Allergies    Patient has no known allergies.    Review  of Systems   Review of Systems  All other systems reviewed and are negative.   Physical Exam Updated Vital Signs BP (!) 143/76   Pulse 84   Temp 97.9 F (36.6 C) (Oral)   Resp 15   Wt 78.5 kg   SpO2 95%   BMI 33.79 kg/m  Physical Exam Vitals and nursing note reviewed.  Constitutional:      General: She is not in acute distress.    Appearance: She is well-developed. She is not diaphoretic.  HENT:     Head: Normocephalic and atraumatic.  Eyes:     Extraocular Movements: Extraocular movements intact.     Pupils: Pupils are equal, round, and reactive to light.  Cardiovascular:     Rate and Rhythm: Normal rate and regular rhythm.     Heart sounds: No murmur heard.    No friction rub. No gallop.  Pulmonary:     Effort: Pulmonary effort is normal. No respiratory distress.     Breath sounds: Normal breath sounds. No wheezing.  Abdominal:     General: Bowel sounds are normal. There is no distension.     Palpations: Abdomen is soft.     Tenderness: There is no abdominal tenderness.  Musculoskeletal:        General: Normal range of motion.     Cervical back: Normal range of motion and neck supple.  Skin:  General: Skin is warm and dry.  Neurological:     General: No focal deficit present.     Mental Status: She is alert. Mental status is at baseline. She is disoriented.     Cranial Nerves: No cranial nerve deficit.     ED Results / Procedures / Treatments   Labs (all labs ordered are listed, but only abnormal results are displayed) Labs Reviewed  BASIC METABOLIC PANEL - Abnormal; Notable for the following components:      Result Value   Sodium 119 (*)    Chloride 88 (*)    Glucose, Bld 152 (*)    All other components within normal limits  CBC  URINALYSIS, ROUTINE W REFLEX MICROSCOPIC  CBG MONITORING, ED  TROPONIN I (HIGH SENSITIVITY)    EKG EKG Interpretation Date/Time:  Monday March 21 2023 03:08:32 EST Ventricular Rate:  94 PR Interval:  213 QRS  Duration:  96 QT Interval:  368 QTC Calculation: 461 R Axis:   -18  Text Interpretation: Sinus rhythm Borderline prolonged PR interval Probable left atrial enlargement Borderline left axis deviation Low voltage, precordial leads Confirmed by Geoffery Lyons ([id number]) on 03/21/2023 3:14:48 AM  Radiology No results found.  Procedures Procedures    Medications Ordered in ED Medications  sodium chloride 0.9 % bolus 1,000 mL (1,000 mLs Intravenous New Bag/Given 03/21/23 0401)    ED Course/ Medical Decision Making/ A&P  Patient is a 67 year old female with history of hypertension and hyperlipidemia presenting with complaints of near syncope as described in the HPI.  Patient arrives here with stable vital signs and is afebrile.  Workup initiated including CBC, basic metabolic panel, and troponin.  CBC and troponin are unremarkable, but BMP shows her sodium to be 119.  Normal saline initiated.  I have spoken with Dr. Antionette Char from the hospitalist service and he is in agreement with me that patient should be admitted for close monitoring of the sodium.  Final Clinical Impression(s) / ED Diagnoses Final diagnoses:  None    Rx / DC Orders ED Discharge Orders     None         Geoffery Lyons, MD 03/21/23 [phone]

## 2023-03-21 NOTE — ED Notes (Signed)
Pt ambulates to bathroom independently with steady gait.

## 2023-03-21 NOTE — ED Notes (Signed)
Iona Beard with cl called for transport

## 2023-03-22 DIAGNOSIS — E871 Hypo-osmolality and hyponatremia: Secondary | ICD-10-CM | POA: Diagnosis not present

## 2023-03-22 LAB — BASIC METABOLIC PANEL
Anion gap: 6 (ref 5–15)
BUN: 9 mg/dL (ref 8–23)
CO2: 21 mmol/L — ABNORMAL LOW (ref 22–32)
Calcium: 8.4 mg/dL — ABNORMAL LOW (ref 8.9–10.3)
Chloride: 106 mmol/L (ref 98–111)
Creatinine, Ser: 0.64 mg/dL (ref 0.44–1.00)
GFR, Estimated: >60 mL/min (ref >60)
Glucose, Bld: 110 mg/dL — ABNORMAL HIGH (ref 70–99)
Potassium: 3.6 mmol/L (ref 3.5–5.1)
Sodium: 133 mmol/L — ABNORMAL LOW (ref 135–145)

## 2023-03-22 NOTE — Discharge Summary (Signed)
Physician Discharge Summary  Kelsey Strickland FTD:[id number] DOB: Jul 03, 1955 DOA: 03/21/2023  PCP: Melida Quitter, MD  Admit date: 03/21/2023 Discharge date: 03/22/2023 30 Day Unplanned Readmission Risk Score    Flowsheet Row ED to Hosp-Admission (Current) from 03/21/2023 in [place] 4TH FLOOR PROGRESSIVE CARE AND UROLOGY  30 Day Unplanned Readmission Risk Score (%) 6.75 Filed at 03/22/2023 0801       This score is the patient's risk of an unplanned readmission within 30 days of being discharged (0 -100%). The score is based on dignosis, age, lab data, medications, orders, and past utilization.   Low:  0-14.9   Medium: 15-21.9   High: 22-29.9   Extreme: 30 and above          Admitted From: Home Disposition: Home  Recommendations for Outpatient Follow-up:  Follow up with PCP in 1-2 weeks Please obtain BMP/CBC in one week Please follow up with your PCP on the following pending results: Unresulted Labs (From admission, onward)     Start     Ordered   03/28/23 0500  Creatinine, serum  (enoxaparin (LOVENOX)    CrCl >/= 30 ml/min)  Weekly,   R     Comments: while on enoxaparin therapy    03/21/23 1445              Home Health: None Equipment/Devices: None  Discharge Condition: Stable CODE STATUS: Full code  Diet recommendation: Cardiac  HPI: Kelsey Strickland is a 67 y.o. female with medical history significant of osteoporosis, hypertension and GERD presented to ED with complaint of dizziness.  Per patient, she was at Christmas party at the neighbors house, that started at 6 PM, at around 8 PM, she started feeling dizzy and weak and sat down to the floor.  This happened twice within few minutes.  She did not lose her consciousness.  She had no other complaint such as chest pain, shortness of breath, problem with vision, speech, any focal deficit, nausea, vomiting, fever, chills, sweating or any other complaint.  She spent few more hours over there and went home around 9  PM.  Tried to went to the bed around 10 PM.  She felt nauseous intermittently but did not vomit.  She was restless and could not sleep until 2:30 AM when her daughter brought her to the emergency department.   ED Course: Upon arrival to ED, she was hemodynamically stable but was found to have sodium of 119.  She was accepted to hospitalist service for further management of hyponatremia.  Between then and arrival to the [facility], her sodium has improved up to 128.  She received 1 L of IV fluid bolus in the ED.3  Subjective: Seen and examined, she is feeling much better than yesterday, no complaints at all.  She is in agreement with going home.  Brief/Interim Summary: Patient was admitted briefly under hospitalist service for hyponatremia.  Her sodium was 119 upon arrival to ED however it improved to 128 with receiving just 1 L of normal saline.  By the time she was admitted, it was 128.  Since her hyponatremia was likely secondary to hypovolemia, we resumed her normal saline and today her sodium is improved to 133.  Her symptoms of near syncope/dizziness were secondary to hyponatremia as well.  She further clarified that prior to this event, for the whole week, she was suffering from URI and did not drink plenty of fluids which indicates the reasoning for dehydration as well.  Regardless,  currently she is fully hydrated.  She is a stable and is being discharged back to home.  I have advised her to make sure to drink plenty of fluids from here on and especially when she gets sick in future.  Discharge plan was discussed with patient and/or family member and they verbalized understanding and agreed with it.  Discharge Diagnoses:  Principal Problem:   Hyponatremia Active Problems:   Near syncope   Essential hypertension   Acute hyponatremia    Discharge Instructions   Allergies as of 03/22/2023   No Known Allergies      Medication List     TAKE these medications    Advil 200 MG  Caps Generic drug: Ibuprofen Take 200 mg by mouth daily as needed (for mild pain or headaches).   alendronate 70 MG tablet Commonly known as: FOSAMAX Take 70 mg by mouth every Wednesday.   amLODipine 5 MG tablet Commonly known as: NORVASC Take 5 mg by mouth daily.   Breo Ellipta 200-25 MCG/ACT Aepb Generic drug: fluticasone furoate-vilanterol Inhale 1 puff into the lungs daily.   CALTRATE 600+D PLUS PO Take 1 tablet by mouth daily.   cholecalciferol 25 MCG (1000 UNIT) tablet Commonly known as: VITAMIN D3 Take 1,000 Units by mouth daily.   fluticasone 50 MCG/ACT nasal spray Commonly known as: FLONASE Place 1 spray into both nostrils in the morning.   montelukast 10 MG tablet Commonly known as: SINGULAIR Take 10 mg by mouth every evening.   olmesartan 40 MG tablet Commonly known as: BENICAR Take 40 mg by mouth in the morning.   rosuvastatin 10 MG tablet Commonly known as: CRESTOR Take 10 mg by mouth 4 (four) times a week.        Follow-up Information     Melida Quitter, MD Follow up in 1 week(s).   Specialty: Internal Medicine Contact information: [address] [place] [address] [phone]                No Known Allergies  Consultations: None   Procedures/Studies: No results found.   Discharge Exam: Vitals:   03/21/23 2215 03/22/23 0256  BP: 128/75 125/72  Pulse: 84 71  Resp: 18 18  Temp: 98.3 F (36.8 C) 97.7 F (36.5 C)  SpO2: 97% 98%   Vitals:   03/21/23 1424 03/21/23 1424 03/21/23 2215 03/22/23 0256  BP: 118/69  128/75 125/72  Pulse: 73 74 84 71  Resp: 18  18 18   Temp: 98.5 F (36.9 C)  98.3 F (36.8 C) 97.7 F (36.5 C)  TempSrc: Oral  Oral   SpO2: 96% 96% 97% 98%  Weight:    80.2 kg  Height:        General: Pt is alert, awake, not in acute distress Cardiovascular: RRR, S1/S2 +, no rubs, no gallops Respiratory: CTA bilaterally, no wheezing, no rhonchi Abdominal: Soft, NT, ND, bowel sounds + Extremities: no edema,  no cyanosis    The results of significant diagnostics from this hospitalization (including imaging, microbiology, ancillary and laboratory) are listed below for reference.     Microbiology: No results found for this or any previous visit (from the past 240 hours).   Labs: BNP (last 3 results) No results for input(s): "BNP" in the last 8760 hours. Basic Metabolic Panel: Recent Labs  Lab 03/21/23 0313 03/21/23 1140 03/21/23 1506 03/21/23 1710 03/22/23 0502  NA 119* 128*  --  129* 133*  K 4.1 3.9  --   --  3.6  CL 88* 98  --   --  106  CO2 22 23  --   --  21*  GLUCOSE 152* 140*  --   --  110*  BUN 19 10  --   --  9  CREATININE 0.82 0.58 0.56  --  0.64  CALCIUM 9.4 9.0  --   --  8.4*   Liver Function Tests: No results for input(s): "AST", "ALT", "ALKPHOS", "BILITOT", "PROT", "ALBUMIN" in the last 168 hours. No results for input(s): "LIPASE", "AMYLASE" in the last 168 hours. No results for input(s): "AMMONIA" in the last 168 hours. CBC: Recent Labs  Lab 03/21/23 0313 03/21/23 1506  WBC 9.0 6.4  HGB 14.0 13.7  HCT 40.4 39.6  MCV 87.1 89.4  PLT 316 314   Cardiac Enzymes: No results for input(s): "CKTOTAL", "CKMB", "CKMBINDEX", "TROPONINI" in the last 168 hours. BNP: Invalid input(s): "POCBNP" CBG: Recent Labs  Lab 03/21/23 1138  GLUCAP 147*   D-Dimer No results for input(s): "DDIMER" in the last 72 hours. Hgb A1c No results for input(s): "HGBA1C" in the last 72 hours. Lipid Profile No results for input(s): "CHOL", "HDL", "LDLCALC", "TRIG", "CHOLHDL", "LDLDIRECT" in the last 72 hours. Thyroid function studies No results for input(s): "TSH", "T4TOTAL", "T3FREE", "THYROIDAB" in the last 72 hours.  Invalid input(s): "FREET3" Anemia work up No results for input(s): "VITAMINB12", "FOLATE", "FERRITIN", "TIBC", "IRON", "RETICCTPCT" in the last 72 hours. Urinalysis    Component Value Date/Time   COLORURINE COLORLESS (A) 03/21/2023 0504   APPEARANCEUR CLEAR  03/21/2023 0504   LABSPEC 1.009 03/21/2023 0504   PHURINE 7.0 03/21/2023 0504   GLUCOSEU NEGATIVE 03/21/2023 0504   HGBUR NEGATIVE 03/21/2023 0504   BILIRUBINUR NEGATIVE 03/21/2023 0504   KETONESUR 15 (A) 03/21/2023 0504   PROTEINUR NEGATIVE 03/21/2023 0504   NITRITE NEGATIVE 03/21/2023 0504   LEUKOCYTESUR NEGATIVE 03/21/2023 0504   Sepsis Labs Recent Labs  Lab 03/21/23 0313 03/21/23 1506  WBC 9.0 6.4   Microbiology No results found for this or any previous visit (from the past 240 hours).  FURTHER DISCHARGE INSTRUCTIONS:   Get Medicines reviewed and adjusted: Please take all your medications with you for your next visit with your Primary MD   Laboratory/radiological data: Please request your Primary MD to go over all hospital tests and procedure/radiological results at the follow up, please ask your Primary MD to get all Hospital records sent to his/her office.   In some cases, they will be blood work, cultures and biopsy results pending at the time of your discharge. Please request that your primary care M.D. goes through all the records of your hospital data and follows up on these results.   Also Note the following: If you experience worsening of your admission symptoms, develop shortness of breath, life threatening emergency, suicidal or homicidal thoughts you must seek medical attention immediately by calling 911 or calling your MD immediately  if symptoms less severe.   You must read complete instructions/literature along with all the possible adverse reactions/side effects for all the Medicines you take and that have been prescribed to you. Take any new Medicines after you have completely understood and accpet all the possible adverse reactions/side effects.    Do not drive when taking Pain medications or sleeping medications (Benzodaizepines)   Do not take more than prescribed Pain, Sleep and Anxiety Medications. It is not advisable to combine anxiety,sleep and pain  medications without talking with your primary care practitioner   Special Instructions: If you have smoked  or chewed Tobacco  in the last 2 yrs please stop smoking, stop any regular Alcohol  and or any Recreational drug use.   Wear Seat belts while driving.   Please note: You were cared for by a hospitalist during your hospital stay. Once you are discharged, your primary care physician will handle any further medical issues. Please note that NO REFILLS for any discharge medications will be authorized once you are discharged, as it is imperative that you return to your primary care physician (or establish a relationship with a primary care physician if you do not have one) for your post hospital discharge needs so that they can reassess your need for medications and monitor your lab values  Time coordinating discharge: Over 30 minutes  SIGNED:   Hughie Closs, MD  [facility] 03/22/2023, 9:52 AM *Please note that this is a verbal dictation therefore any spelling or grammatical errors are due to the "[employer] One" system interpretation. If 7PM-7AM, please contact night-coverage [email or web address]

## 2023-03-22 NOTE — Care Management CC44 (Signed)
Condition Code 44 Documentation Completed  Patient Details  Name: TAJA WANTUCH MRN: [id number] Date of Birth: 1955-06-09   Condition Code 44 given:  Yes Patient signature on Condition Code 44 notice:  Yes Documentation of 2 MD's agreement:  Yes Code 44 added to claim:  Yes    Lanier Clam, RN 03/22/2023, 10:03 AM

## 2023-03-22 NOTE — TOC Transition Note (Signed)
Transition of Care [facility]) - Discharge Note   Patient Details  Name: LUZCLARITA BLILEY MRN: [id number] Date of Birth: 1956/02/06  [facility] CM/SW Contact:  Lanier Clam, RN Phone Number: 03/22/2023, 10:17 AM   Clinical Narrative:  d/c home.     Final next level of care: Home/Self Care Barriers to Discharge: No Barriers Identified   Patient Goals and CMS Choice Patient states their goals for this hospitalization and ongoing recovery are:: Home CMS Medicare.gov Compare Post Acute Care list provided to:: Patient Choice offered to / list presented to : Patient [place] ownership interest in [facility].provided to:: Patient    Discharge Placement                       Discharge Plan and Services Additional resources added to the After Visit Summary for                                       Social Drivers of Health (SDOH) Interventions SDOH Screenings   Food Insecurity: No Food Insecurity (03/21/2023)  Housing: Low Risk  (03/21/2023)  Transportation Needs: No Transportation Needs (03/21/2023)  Utilities: Not At Risk (03/21/2023)  Tobacco Use: Medium Risk (03/21/2023)     Readmission Risk Interventions     No data to display

## 2023-03-22 NOTE — Care Management Obs Status (Signed)
MEDICARE OBSERVATION STATUS NOTIFICATION   Patient Details  Name: Kelsey Strickland MRN: [id number] Date of Birth: [birth year]   Medicare Observation Status Notification Given:  Yes    MahabirOlegario Messier, RN 03/22/2023, 10:06 AM

## 2023-05-01 IMAGING — US US BREAST*L* LIMITED INC AXILLA
1 series · 6 of 6 positions shown · non-contrast
Comparison: Previous exam(s).
COMPARISON: Previous exams.

ACR BREAST DENSITY: b.  Scattered fibroglandular tissue.
COMPARISON: Previous exam(s).

Addendum:
CLINICAL DATA: Patient describes a palpable lump within the outer
LEFT breast.

EXAM:
DIGITAL DIAGNOSTIC UNILATERAL LEFT MAMMOGRAM WITH TOMOSYNTHESIS AND
CAD; ULTRASOUND LEFT BREAST LIMITED
TECHNIQUE: Left digital diagnostic mammography and breast tomosynthesis was
performed. The images were evaluated with computer-aided detection.;
Targeted ultrasound examination of the left breast was performed.
TECHNIQUE: RIGHT breast mammogram with breast tomosynthesis was
performed. The images were evaluated with computer-aided detection.

[Series 1: us breast*left* limited inc axilla · 0.03mm/px · 6 of 6 slices shown]
[im 1/6]
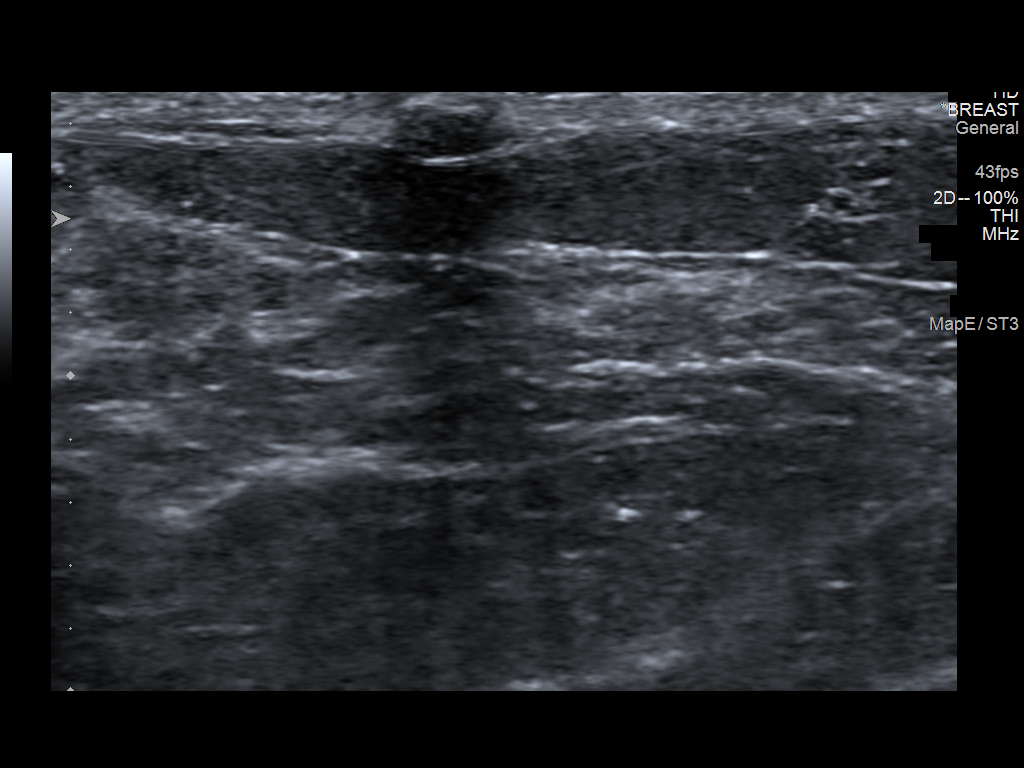
[im 2/6]
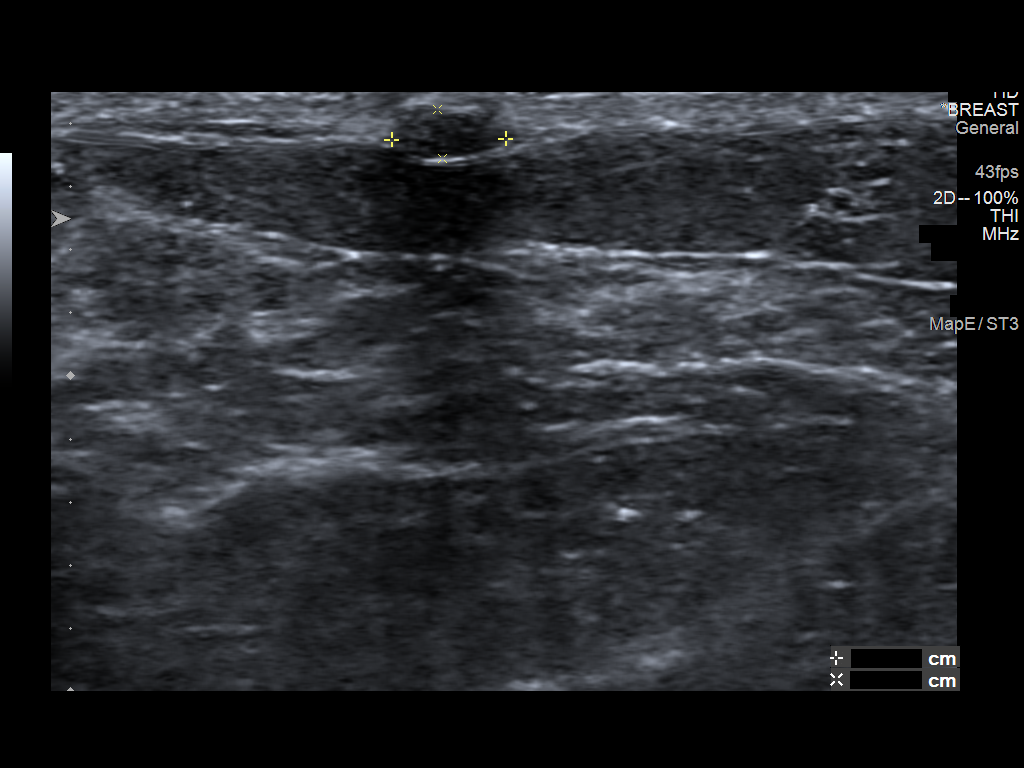
[im 3/6]
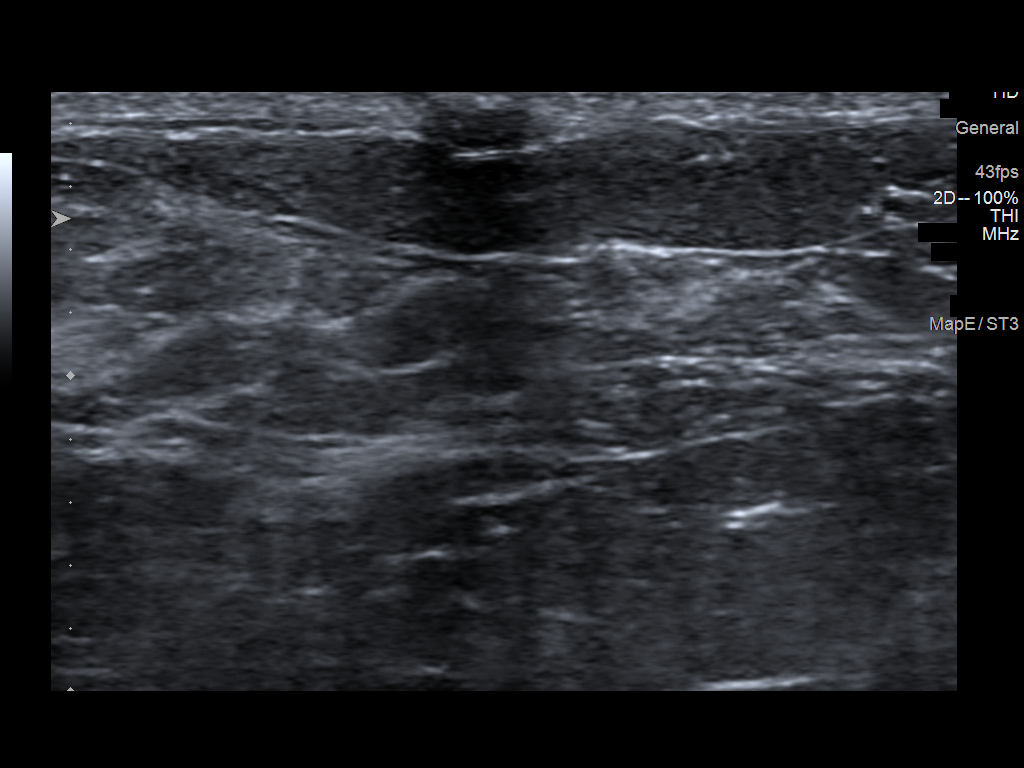
[im 4/6]
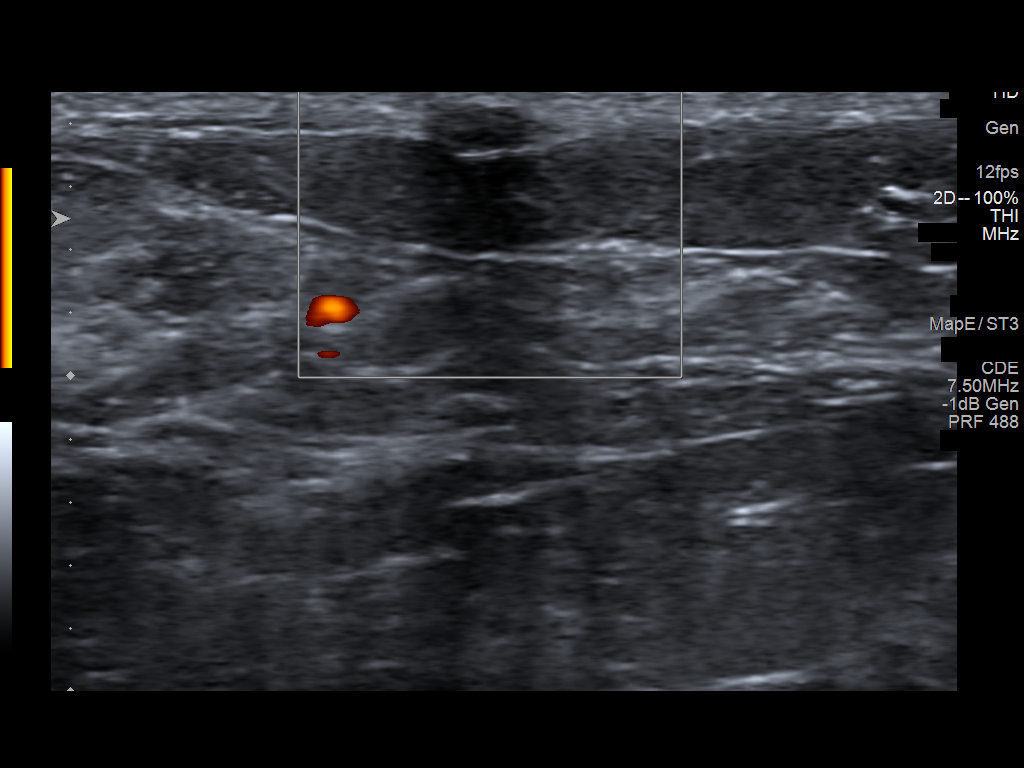
[im 5/6]
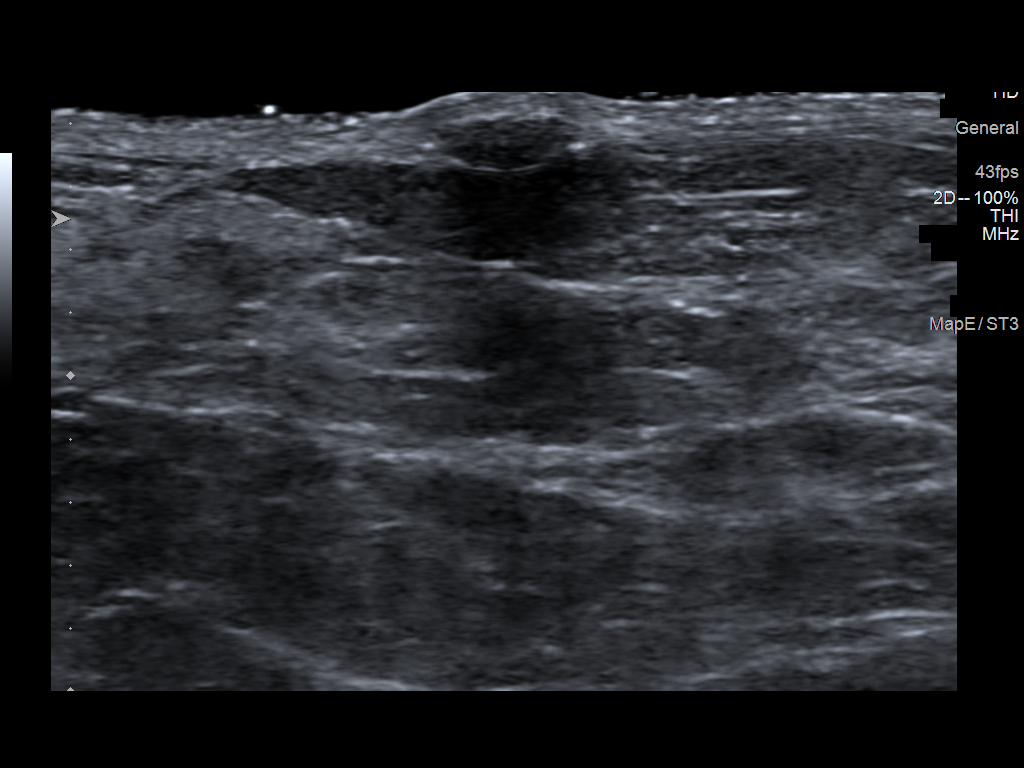
[im 6/6]
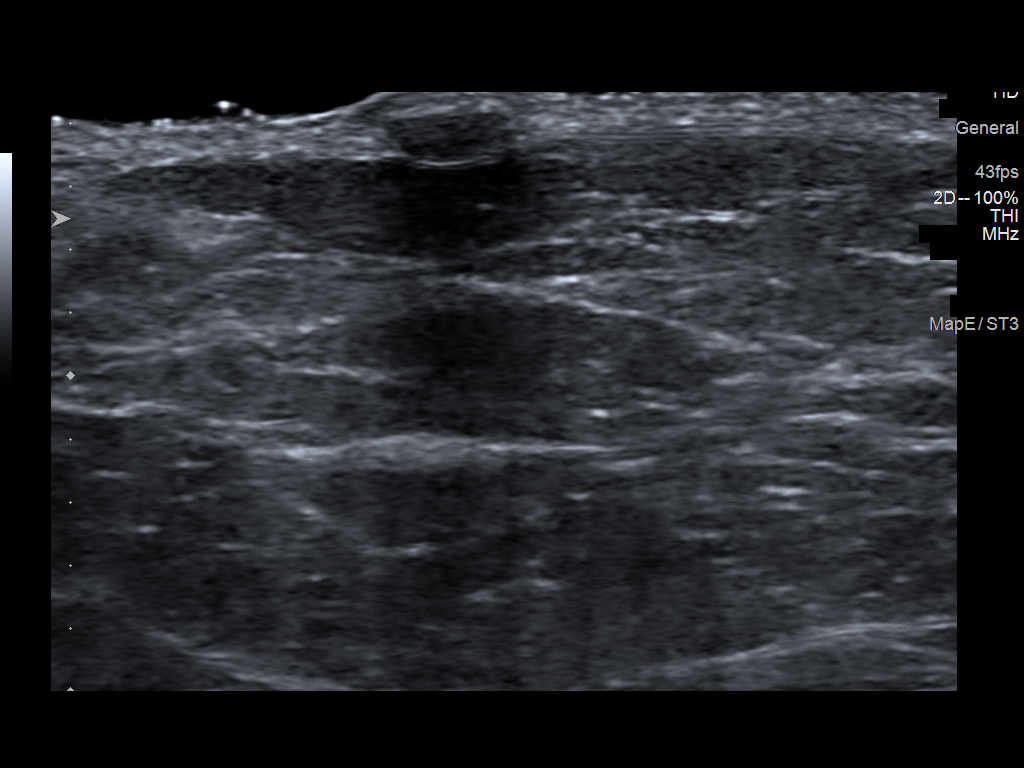

[6 of 6 positions shown; findings below may reference images not displayed]

ACR Breast Density Category b: There are scattered areas of
fibroglandular density.
FINDINGS: There is a partially obscured mass within the outer LEFT breast, at
superficial depth and possibly within the skin, measuring
approximately 6 mm greatest dimension, corresponding to the palpable
area of concern with overlying skin marker in place.

There are no new dominant masses, suspicious calcifications or
secondary signs of malignancy elsewhere within the LEFT breast.

Targeted ultrasound is performed, showing a small benign sebaceous
cyst localized to the skin of the LEFT breast at the 2:30 o'clock
axis, 3 cm from the nipple, measuring 4 x 2 mm, corresponding to the
palpable area of concern. Underlying breast tissues appear normal.
IMPRESSION: 1. No evidence of malignancy within the LEFT breast.
2. Small benign sebaceous cyst within the skin of the LEFT breast at
the 2:30 o'clock axis, measuring 4 mm, corresponding to the palpable
area of concern.

RECOMMENDATION:
1. Annual screening mammograms. Next RIGHT breast screening
mammogram will be due in Sunday July, 2021. Patient is aware.
2. Patient was reassured that sebaceous cysts are benign findings
and typically self-limiting. Patient was informed that warm
compresses can expedite healing. The patient was instructed to
return if the area that she feels becomes larger or firmer to
palpation. Patient was encouraged to follow-up with referring
physician if any associated skin redness, skin warmth, or pain
developed at the site as surgical consultation might then be needed
for definitive treatment.

I have discussed the findings and recommendations with the patient.
If applicable, a reminder letter will be sent to the patient
regarding the next appointment.

BI-RADS CATEGORY  2: Benign.

ADDENDUM:
RIGHT breast mammogram was also obtained today, in addition to the
LEFT breast diagnostic mammogram and ultrasound.
FINDINGS: There are no new dominant masses, suspicious
calcifications or secondary signs of malignancy within the RIGHT
breast.
IMPRESSION: No evidence of malignancy within the RIGHT breast.

RECOMMENDATION: Bilateral screening mammogram in one
year.(Code:[id number])

BI-RADS CATEGORY: 2: Benign.

*** End of Addendum ***
ACR Breast Density Category b: There are scattered areas of
fibroglandular density.
FINDINGS: There is a partially obscured mass within the outer LEFT breast, at
superficial depth and possibly within the skin, measuring
approximately 6 mm greatest dimension, corresponding to the palpable
area of concern with overlying skin marker in place.

There are no new dominant masses, suspicious calcifications or
secondary signs of malignancy elsewhere within the LEFT breast.

Targeted ultrasound is performed, showing a small benign sebaceous
cyst localized to the skin of the LEFT breast at the 2:30 o'clock
axis, 3 cm from the nipple, measuring 4 x 2 mm, corresponding to the
palpable area of concern. Underlying breast tissues appear normal.
IMPRESSION: 1. No evidence of malignancy within the LEFT breast.
2. Small benign sebaceous cyst within the skin of the LEFT breast at
the 2:30 o'clock axis, measuring 4 mm, corresponding to the palpable
area of concern.

RECOMMENDATION:
1. Annual screening mammograms. Next RIGHT breast screening
mammogram will be due in Sunday July, 2021. Patient is aware.
2. Patient was reassured that sebaceous cysts are benign findings
and typically self-limiting. Patient was informed that warm
compresses can expedite healing. The patient was instructed to
return if the area that she feels becomes larger or firmer to
palpation. Patient was encouraged to follow-up with referring
physician if any associated skin redness, skin warmth, or pain
developed at the site as surgical consultation might then be needed
for definitive treatment.

I have discussed the findings and recommendations with the patient.
If applicable, a reminder letter will be sent to the patient
regarding the next appointment.

BI-RADS CATEGORY  2: Benign.

## 2023-05-18 ENCOUNTER — Other Ambulatory Visit: Payer: Self-pay | Admitting: Internal Medicine

## 2023-05-18 DIAGNOSIS — Z1231 Encounter for screening mammogram for malignant neoplasm of breast: Secondary | ICD-10-CM

## 2023-05-18 DIAGNOSIS — M810 Age-related osteoporosis without current pathological fracture: Secondary | ICD-10-CM

## 2023-06-17 ENCOUNTER — Ambulatory Visit
Admission: RE | Admit: 2023-06-17 | Discharge: 2023-06-17 | Disposition: A | Discharge status: Home / Self Care | Payer: Medicare Other | Source: Ambulatory Visit

## 2023-06-17 DIAGNOSIS — Z1231 Encounter for screening mammogram for malignant neoplasm of breast: Secondary | ICD-10-CM

## 2023-12-21 ENCOUNTER — Encounter (HOSPITAL_COMMUNITY): Payer: Self-pay | Admitting: Internal Medicine

## 2023-12-21 ENCOUNTER — Other Ambulatory Visit (HOSPITAL_COMMUNITY): Payer: Self-pay | Admitting: Internal Medicine

## 2023-12-21 DIAGNOSIS — M810 Age-related osteoporosis without current pathological fracture: Secondary | ICD-10-CM | POA: Insufficient documentation

## 2024-01-17 ENCOUNTER — Encounter (HOSPITAL_COMMUNITY): Payer: Self-pay | Admitting: Internal Medicine

## 2024-01-17 ENCOUNTER — Ambulatory Visit (HOSPITAL_COMMUNITY)
Admission: RE | Admit: 2024-01-17 | Discharge: 2024-01-17 | Disposition: A | Discharge status: Home / Self Care | Source: Ambulatory Visit | Attending: Internal Medicine | Admitting: Internal Medicine

## 2024-01-17 VITALS — BP 155/82 | HR 71 | Temp 97.7°F

## 2024-01-17 DIAGNOSIS — M810 Age-related osteoporosis without current pathological fracture: Secondary | ICD-10-CM | POA: Insufficient documentation

## 2024-01-17 MED ORDER — ZOLEDRONIC ACID 5 MG/100ML IV SOLN
5.0000 mg | Freq: Once | INTRAVENOUS | Status: AC
  Administered 2024-01-17: 5 mg via INTRAVENOUS

## 2024-01-17 MED ORDER — ZOLEDRONIC ACID 5 MG/100ML IV SOLN
INTRAVENOUS | Status: AC
  Filled 2024-01-17: qty 100

## 2024-01-17 MED ORDER — SODIUM CHLORIDE 0.9 % IV SOLN: INTRAVENOUS | Status: DC

## 2024-06-18 ENCOUNTER — Encounter

## 2024-06-18 DIAGNOSIS — Z1231 Encounter for screening mammogram for malignant neoplasm of breast: Secondary | ICD-10-CM
